# Patient Record
Sex: Male | Born: 1970 | Race: Black or African American | Hispanic: No | Marital: Single | Smoking: Never smoker
Health system: Southern US, Community
[De-identification: ages and names within clinical notes are randomized; demographics above are authoritative.]

## PROBLEM LIST (undated history)

## (undated) DIAGNOSIS — I10 Essential (primary) hypertension: Secondary | ICD-10-CM

---

## 2012-08-05 ENCOUNTER — Emergency Department (HOSPITAL_BASED_OUTPATIENT_CLINIC_OR_DEPARTMENT_OTHER): Payer: Self-pay

## 2012-08-05 ENCOUNTER — Emergency Department (HOSPITAL_BASED_OUTPATIENT_CLINIC_OR_DEPARTMENT_OTHER)
Admission: EM | Admit: 2012-08-05 | Discharge: 2012-08-05 | Disposition: A | Discharge status: Other Acute Inpatient Hospital | Payer: Self-pay | Attending: Emergency Medicine | Admitting: Emergency Medicine

## 2012-08-05 ENCOUNTER — Encounter (HOSPITAL_BASED_OUTPATIENT_CLINIC_OR_DEPARTMENT_OTHER): Payer: Self-pay

## 2012-08-05 DIAGNOSIS — I10 Essential (primary) hypertension: Secondary | ICD-10-CM | POA: Insufficient documentation

## 2012-08-05 DIAGNOSIS — I200 Unstable angina: Secondary | ICD-10-CM | POA: Insufficient documentation

## 2012-08-05 DIAGNOSIS — R072 Precordial pain: Secondary | ICD-10-CM | POA: Insufficient documentation

## 2012-08-05 DIAGNOSIS — R0602 Shortness of breath: Secondary | ICD-10-CM | POA: Insufficient documentation

## 2012-08-05 HISTORY — DX: Essential (primary) hypertension: I10

## 2012-08-05 LAB — COMPREHENSIVE METABOLIC PANEL
BUN: 7 mg/dL (ref 6–23)
CO2: 28 mEq/L (ref 19–32)
Calcium: 9.5 mg/dL (ref 8.4–10.5)
Creatinine, Ser: 1.2 mg/dL (ref 0.50–1.35)
GFR calc Af Amer: 85 mL/min — ABNORMAL LOW (ref >90)
GFR calc non Af Amer: 74 mL/min — ABNORMAL LOW (ref >90)
Glucose, Bld: 96 mg/dL (ref 70–99)
Sodium: 139 mEq/L (ref 135–145)
Total Protein: 7.7 g/dL (ref 6.0–8.3)

## 2012-08-05 LAB — CBC WITH DIFFERENTIAL/PLATELET
Eosinophils Absolute: 0.5 10*3/uL (ref 0.0–0.7)
Eosinophils Relative: 10 % — ABNORMAL HIGH (ref 0–5)
HCT: 43.7 % (ref 39.0–52.0)
Lymphocytes Relative: 48 % — ABNORMAL HIGH (ref 12–46)
Lymphs Abs: 2.2 10*3/uL (ref 0.7–4.0)
MCH: 30.7 pg (ref 26.0–34.0)
MCV: 86 fL (ref 78.0–100.0)
Monocytes Absolute: 0.4 10*3/uL (ref 0.1–1.0)
Platelets: 205 10*3/uL (ref 150–400)
RBC: 5.08 MIL/uL (ref 4.22–5.81)
WBC: 4.6 10*3/uL (ref 4.0–10.5)

## 2012-08-05 LAB — TROPONIN I: Troponin I: <0.3 ng/mL (ref <0.30)

## 2012-08-05 MED ORDER — IOHEXOL 350 MG/ML SOLN
100.0000 mL | Freq: Once | INTRAVENOUS | Status: AC | PRN
  Administered 2012-08-05: 100 mL via INTRAVENOUS

## 2012-08-05 MED ORDER — NITROGLYCERIN 0.4 MG SL SUBL
0.4000 mg | SUBLINGUAL_TABLET | SUBLINGUAL | Status: DC | PRN
  Filled 2012-08-05: qty 25

## 2012-08-05 MED ORDER — ASPIRIN 81 MG PO CHEW
324.0000 mg | CHEWABLE_TABLET | Freq: Once | ORAL | Status: AC
  Administered 2012-08-05: 324 mg via ORAL
  Filled 2012-08-05: qty 4

## 2012-08-05 NOTE — ED Notes (Signed)
Pt care transferred to [employer]. Pt and family aware of pending transport to [facility] and inpatient admit to room #700.

## 2012-08-05 NOTE — ED Provider Notes (Addendum)
History     CSN: [id number]  Arrival date & time 08/05/12  0818   First MD Initiated Contact with Patient 08/05/12 [phone]      Chief Complaint  Patient presents with  . Chest Pain    (Consider location/radiation/quality/duration/timing/severity/associated sxs/prior treatment) Patient is a 42 y.o. male presenting with chest pain. The history is provided by the patient.  Chest Pain Pain location:  Substernal area Pain quality: tightness   Pain radiates to:  Does not radiate Pain radiates to the back: no   Pain severity:  Moderate Onset quality:  Sudden Duration:  5 minutes Timing:  Intermittent Progression:  Partially resolved Chronicity:  Recurrent Context comment:  States for the last few weeks to 1 month intermittent chest pain that causes SOB.  Does not feel that it is exacerbated by activity and not changed with eating. Relieved by:  Rest Worsened by:  Nothing tried Associated symptoms: shortness of breath   Associated symptoms: no abdominal pain, no cough, no heartburn, no lower extremity edema, no nausea, not vomiting and no weakness   Risk factors: hypertension   Risk factors: no diabetes mellitus, no high cholesterol, no smoking and no surgery   Risk factors comment:  Unknown family hx   Past Medical History  Diagnosis Date  . Hypertension     History reviewed. No pertinent past surgical history.  No family history on file.  History  Substance Use Topics  . Smoking status: Never Smoker   . Smokeless tobacco: Not on file  . Alcohol Use: No      Review of Systems  Respiratory: Positive for shortness of breath. Negative for cough.   Cardiovascular: Positive for chest pain.  Gastrointestinal: Negative for heartburn, nausea, vomiting and abdominal pain.  Neurological: Negative for weakness.  All other systems reviewed and are negative.    Allergies  Review of patient's allergies indicates no known allergies.  Home Medications  No current outpatient  prescriptions on file.  BP 136/86  Pulse 72  Temp(Src) 98.6 F (37 C) (Oral)  Resp 18  Ht 6\' 2"  (1.88 m)  Wt 240 lb (108.863 kg)  BMI 30.8 kg/m2  SpO2 100%  Physical Exam  Nursing note and vitals reviewed. Constitutional: He is oriented to person, place, and time. He appears well-developed and well-nourished. No distress.  HENT:  Head: Normocephalic and atraumatic.  Mouth/Throat: Oropharynx is clear and moist.  Eyes: Conjunctivae and EOM are normal. Pupils are equal, round, and reactive to light.  Neck: Normal range of motion. Neck supple.  Cardiovascular: Normal rate, regular rhythm and intact distal pulses.   No murmur heard. Pulmonary/Chest: Effort normal and breath sounds normal. No respiratory distress. He has no wheezes. He has no rales.  Abdominal: Soft. He exhibits no distension. There is no tenderness. There is no rebound and no guarding.  Musculoskeletal: Normal range of motion. He exhibits no edema and no tenderness.  Neurological: He is alert and oriented to person, place, and time.  Skin: Skin is warm and dry. No rash noted. No erythema.  Psychiatric: He has a normal mood and affect. His behavior is normal.    ED Course  Procedures (including critical care time)  Labs Reviewed  CBC WITH DIFFERENTIAL - Abnormal; Notable for the following:    Neutrophils Relative % 34 (*)    Neutro Abs 1.6 (*)    Lymphocytes Relative 48 (*)    Eosinophils Relative 10 (*)    All other components within normal limits  COMPREHENSIVE METABOLIC  PANEL - Abnormal; Notable for the following:    Potassium 3.3 (*)    GFR calc non Af Amer 74 (*)    GFR calc Af Amer 85 (*)    All other components within normal limits  TROPONIN I   Dg Chest 2 View  08/05/2012   *RADIOLOGY REPORT*  Clinical Data: Chest pain  CHEST - 2 VIEW  Comparison: None.  Findings: Heart size is upper normal.  Negative for heart failure.  Mild left lower lobe airspace disease may represent atelectasis or pneumonia.  No  effusion.  Right lung is clear.  Negative for mass or adenopathy  IMPRESSION: Mild left lower lobe airspace disease.  This may represent atelectasis or pneumonia.   Original Report Authenticated By: Janeece Riggers, M.D.   Ct Angio Chest Pe W/cm &/or Wo Cm  08/05/2012   *RADIOLOGY REPORT*  Clinical Data: Pain  CT ANGIOGRAPHY CHEST  Technique:  Multidetector CT imaging of the chest using the standard protocol during bolus administration of intravenous contrast. Multiplanar reconstructed images including MIPs were obtained and reviewed to evaluate the vascular anatomy.  Contrast: OMNIPAQUE IOHEXOL 350 MG/ML SOLN  Comparison: Chest x-ray from earlier in the same day.  Findings: The lungs are well-aerated bilaterally without evidence of focal infiltrate or sizable effusion.  In the right middle lobe laterally, there is a 6-7 mm nodule identified best seen on image number 52 of series 6.  No other parenchymal nodules are seen.  The hilar and mediastinal structures are within normal limits.  No significant lymphadenopathy is seen.  The thoracic aorta and pulmonary artery are well visualized within normal limits.  There are no findings to suggest pulmonary emboli.  Scanning into the upper abdomen shows no acute abnormality.  The bony structures are within normal limits.  IMPRESSION: No evidence of pulmonary emboli.  6-7 mm nodule within the right middle lobe laterally as described.  If the patient is at high risk for bronchogenic carcinoma, follow- up chest CT at 3-6 months is recommended.  If the patient is at low risk for bronchogenic carcinoma, follow-up chest CT at 6-12 months is recommended.  This recommendation follows the consensus statement: Guidelines for Management of Small Pulmonary Nodules Detected on CT Scans: A Statement from the Fleischner Society as published in Radiology 2005; 237:395-400.   Original Report Authenticated By: Alcide Clever, M.D.     Date: 08/05/2012  Rate: 67  Rhythm: normal sinus  rhythm and sinus arrhythmia  QRS Axis: normal  Intervals: normal  ST/T Wave abnormalities: t-wave inversion anteriorly  Conduction Disutrbances:none  Narrative Interpretation:   Old EKG Reviewed: none available   1. Unstable angina       MDM   Patient here complaining of atypical chest pain that he describes as a tightness with associated shortness of breath it's been intermittent for the last few weeks to 1 month. Patient has a history of hypertension and does take blood pressure medication but denies being a smoker or any other medical problems that he knows of. However patients lived in the [place] for the last 7 years and has lived in the [place] for the last one year before that he was from [place] and states that he had malaria and fevers often but did not seek medical care.  He denies any heart issues that he knows of an unclear family history.  Today patient's EKG shows T wave inversion anteriorly without any prior EKG for comparison. He initially was having mild chest pain which  was resolved after aspirin and nitroglycerin. The symptoms do not appear to be GI in he denies any cough. Initial labs are normal the chest x-ray showed a possible middle air space opacity. However patient is denying cough or fever and on CTA to evaluate for PE there was no sign of infiltrate but there was a nodule seen on the right however patient is not at high risk for progression of carcinoma.  Will admit for chest pain rule out given EKG changes the patient's story and history of hypertension         Gwyneth Sprout, MD 08/05/12 1034  Gwyneth Sprout, MD 08/05/12 1101

## 2012-08-05 NOTE — ED Notes (Signed)
Pt reports left chest wall pain that started 1 month ago, associated with SHOB.

## 2012-08-05 NOTE — ED Notes (Signed)
Pt denies any chest pain or pressure or sharpness at this time. Will notify this rn if pain returns.

## 2012-08-05 NOTE — ED Notes (Signed)
Patient transported to X-ray 

## 2015-02-24 ENCOUNTER — Emergency Department (HOSPITAL_BASED_OUTPATIENT_CLINIC_OR_DEPARTMENT_OTHER)
Admission: EM | Admit: 2015-02-24 | Discharge: 2015-02-24 | Disposition: A | Discharge status: Home / Self Care | Payer: Self-pay | Attending: Emergency Medicine | Admitting: Emergency Medicine

## 2015-02-24 ENCOUNTER — Encounter (HOSPITAL_BASED_OUTPATIENT_CLINIC_OR_DEPARTMENT_OTHER): Payer: Self-pay

## 2015-02-24 DIAGNOSIS — J069 Acute upper respiratory infection, unspecified: Secondary | ICD-10-CM | POA: Insufficient documentation

## 2015-02-24 DIAGNOSIS — I10 Essential (primary) hypertension: Secondary | ICD-10-CM | POA: Insufficient documentation

## 2015-02-24 MED ORDER — GUAIFENESIN-CODEINE 100-10 MG/5ML PO SOLN: 10.0000 mL | Freq: Four times a day (QID) | ORAL | Status: AC | PRN

## 2015-02-24 NOTE — ED Notes (Signed)
Pt c/o running nose, cough, headache and chills since last night

## 2015-02-24 NOTE — Discharge Instructions (Signed)
Robitussin with codeine as needed for cough.  Tylenol 1000 mg rotated with ibuprofen 600 mg every 3 hours as needed for fever.  Follow-up with your primary Dr. if symptoms are not improving in the next week.   Upper Respiratory Infection, Adult Most upper respiratory infections (URIs) are a viral infection of the air passages leading to the lungs. A URI affects the nose, throat, and upper air passages. The most common type of URI is nasopharyngitis and is typically referred to as "the common cold." URIs run their course and usually go away on their own. Most of the time, a URI does not require medical attention, but sometimes a bacterial infection in the upper airways can follow a viral infection. This is called a secondary infection. Sinus and middle ear infections are common types of secondary upper respiratory infections. Bacterial pneumonia can also complicate a URI. A URI can worsen asthma and chronic obstructive pulmonary disease (COPD). Sometimes, these complications can require emergency medical care and may be life threatening.  CAUSES Almost all URIs are caused by viruses. A virus is a type of germ and can spread from one person to another.  RISKS FACTORS You may be at risk for a URI if:   You smoke.   You have chronic heart or lung disease.  You have a weakened defense (immune) system.   You are very young or very old.   You have nasal allergies or asthma.  You work in crowded or poorly ventilated areas.  You work in health care facilities or schools. SIGNS AND SYMPTOMS  Symptoms typically develop 2-3 days after you come in contact with a cold virus. Most viral URIs last 7-10 days. However, viral URIs from the influenza virus (flu virus) can last 14-18 days and are typically more severe. Symptoms may include:   Runny or stuffy (congested) nose.   Sneezing.   Cough.   Sore throat.   Headache.   Fatigue.   Fever.   Loss of appetite.   Pain in your  forehead, behind your eyes, and over your cheekbones (sinus pain).  Muscle aches.  DIAGNOSIS  Your health care provider may diagnose a URI by:  Physical exam.  Tests to check that your symptoms are not due to another condition such as:  Strep throat.  Sinusitis.  Pneumonia.  Asthma. TREATMENT  A URI goes away on its own with time. It cannot be cured with medicines, but medicines may be prescribed or recommended to relieve symptoms. Medicines may help:  Reduce your fever.  Reduce your cough.  Relieve nasal congestion. HOME CARE INSTRUCTIONS   Take medicines only as directed by your health care provider.   Gargle warm saltwater or take cough drops to comfort your throat as directed by your health care provider.  Use a warm mist humidifier or inhale steam from a shower to increase air moisture. This may make it easier to breathe.  Drink enough fluid to keep your urine clear or pale yellow.   Eat soups and other clear broths and maintain good nutrition.   Rest as needed.   Return to work when your temperature has returned to normal or as your health care provider advises. You may need to stay home longer to avoid infecting others. You can also use a face mask and careful hand washing to prevent spread of the virus.  Increase the usage of your inhaler if you have asthma.   Do not use any tobacco products, including cigarettes, chewing tobacco, or  electronic cigarettes. If you need help quitting, ask your health care provider. PREVENTION  The best way to protect yourself from getting a cold is to practice good hygiene.   Avoid oral or hand contact with people with cold symptoms.   Wash your hands often if contact occurs.  There is no clear evidence that vitamin C, vitamin E, echinacea, or exercise reduces the chance of developing a cold. However, it is always recommended to get plenty of rest, exercise, and practice good nutrition.  SEEK MEDICAL CARE IF:   You  are getting worse rather than better.   Your symptoms are not controlled by medicine.   You have chills.  You have worsening shortness of breath.  You have brown or red mucus.  You have yellow or brown nasal discharge.  You have pain in your face, especially when you bend forward.  You have a fever.  You have swollen neck glands.  You have pain while swallowing.  You have white areas in the back of your throat. SEEK IMMEDIATE MEDICAL CARE IF:   You have severe or persistent:  Headache.  Ear pain.  Sinus pain.  Chest pain.  You have chronic lung disease and any of the following:  Wheezing.  Prolonged cough.  Coughing up blood.  A change in your usual mucus.  You have a stiff neck.  You have changes in your:  Vision.  Hearing.  Thinking.  Mood. MAKE SURE YOU:   Understand these instructions.  Will watch your condition.  Will get help right away if you are not doing well or get worse.   This information is not intended to replace advice given to you by your health care provider. Make sure you discuss any questions you have with your health care provider.   Document Released: 08/09/2000 Document Revised: 06/30/2014 Document Reviewed: 05/21/2013 Elsevier Interactive Patient Education [employer]2016 [employer]vier Inc.

## 2015-02-24 NOTE — ED Provider Notes (Signed)
CSN: [id number]     Arrival date & time 02/24/15  0507 History   First MD Initiated Contact with Patient 02/24/15 0525     Chief Complaint  Patient presents with  . URI     (Consider location/radiation/quality/duration/timing/severity/associated sxs/prior Treatment) HPI Comments: Patient is a 44 year old male with history of hypertension. He presents today for evaluation of runny nose, cough, congestion, headache, and feeling chilled. This started just over 6 hours prior to presentation. He denies any chest pain or shortness of breath. Not tried taking anything for his symptoms. He reports that he cannot sleep because he feels so bad.  Patient is a 44 y.o. male presenting with URI. The history is provided by the patient.  URI Presenting symptoms: congestion, cough, rhinorrhea and sore throat   Severity:  Moderate Onset quality:  Sudden Duration:  6 hours Timing:  Constant Progression:  Worsening Chronicity:  New Relieved by:  Nothing Worsened by:  Nothing tried Ineffective treatments:  None tried   Past Medical History  Diagnosis Date  . Hypertension    History reviewed. No pertinent past surgical history. No family history on file. Social History  Substance Use Topics  . Smoking status: Never Smoker   . Smokeless tobacco: None  . Alcohol Use: No    Review of Systems  HENT: Positive for congestion, rhinorrhea and sore throat.   Respiratory: Positive for cough.   All other systems reviewed and are negative.     Allergies  Review of patient's allergies indicates no known allergies.  Home Medications   Prior to Admission medications   Not on File   BP 152/101 mmHg  Pulse 86  Temp(Src) 99.2 F (37.3 C) (Oral)  Resp 18  Ht 6\' 2"  (1.88 m)  Wt 236 lb (107.049 kg)  BMI 30.29 kg/m2  SpO2 100% Physical Exam  Constitutional: He is oriented to person, place, and time. He appears well-developed and well-nourished. No distress.  HENT:  Head: Normocephalic and  atraumatic.  Mouth/Throat: Oropharynx is clear and moist.  TMs are clear bilaterally.  Neck: Normal range of motion. Neck supple.  Cardiovascular: Normal rate, regular rhythm and normal heart sounds.   No murmur heard. Pulmonary/Chest: Effort normal and breath sounds normal. No respiratory distress. He has no wheezes. He has no rales.  Abdominal: Soft. Bowel sounds are normal. He exhibits no distension. There is no tenderness. There is no rebound.  Musculoskeletal: Normal range of motion. He exhibits no edema.  Lymphadenopathy:    He has no cervical adenopathy.  Neurological: He is alert and oriented to person, place, and time.  Skin: Skin is warm and dry. He is not diaphoretic.  Nursing note and vitals reviewed.   ED Course  Procedures (including critical care time) Labs Review Labs Reviewed - No data to display  Imaging Review No results found. I have personally reviewed and evaluated these images and lab results as part of my medical decision-making.   EKG Interpretation None      MDM   Final diagnoses:  None    Patient presents with a several hour history of URI-like symptoms that I highly suspect are viral in nature. His lungs are clear and oxygen saturations are 100%. He is nontoxic-appearing and in no acute distress.  I explained to him that his illness is most likely viral and that the only treatment is symptomatic relief. I advised him to take over-the-counter medications as needed for his aches and pains, and congestion and cough. I will also prescribe  Robitussin with codeine which she can take primarily at night which should help him get some rest and suppress his cough.  I see no indication for antibiotics. His symptoms started hours ago.    Geoffery Lyons, MD 02/24/15 [phone]

## 2017-02-06 ENCOUNTER — Other Ambulatory Visit: Payer: Self-pay | Admitting: Internal Medicine

## 2017-02-06 DIAGNOSIS — H0520 Unspecified exophthalmos: Secondary | ICD-10-CM

## 2017-02-17 ENCOUNTER — Ambulatory Visit
Admission: RE | Admit: 2017-02-17 | Discharge: 2017-02-17 | Disposition: A | Discharge status: Home / Self Care | Payer: Self-pay | Source: Ambulatory Visit | Attending: Internal Medicine | Admitting: Internal Medicine

## 2017-02-17 DIAGNOSIS — H0520 Unspecified exophthalmos: Secondary | ICD-10-CM

## 2017-02-17 MED ORDER — GADOBENATE DIMEGLUMINE 529 MG/ML IV SOLN
20.0000 mL | Freq: Once | INTRAVENOUS | Status: AC | PRN
  Administered 2017-02-17: 20 mL via INTRAVENOUS

## 2017-06-27 ENCOUNTER — Encounter (HOSPITAL_BASED_OUTPATIENT_CLINIC_OR_DEPARTMENT_OTHER): Payer: Self-pay | Admitting: *Deleted

## 2017-06-27 ENCOUNTER — Emergency Department (HOSPITAL_BASED_OUTPATIENT_CLINIC_OR_DEPARTMENT_OTHER): Payer: BLUE CROSS/BLUE SHIELD

## 2017-06-27 ENCOUNTER — Other Ambulatory Visit: Payer: Self-pay

## 2017-06-27 ENCOUNTER — Emergency Department (HOSPITAL_BASED_OUTPATIENT_CLINIC_OR_DEPARTMENT_OTHER)
Admission: EM | Admit: 2017-06-27 | Discharge: 2017-06-27 | Disposition: A | Discharge status: Home / Self Care | Payer: BLUE CROSS/BLUE SHIELD | Attending: Emergency Medicine | Admitting: Emergency Medicine

## 2017-06-27 DIAGNOSIS — R05 Cough: Secondary | ICD-10-CM

## 2017-06-27 DIAGNOSIS — I10 Essential (primary) hypertension: Secondary | ICD-10-CM | POA: Insufficient documentation

## 2017-06-27 DIAGNOSIS — Z79899 Other long term (current) drug therapy: Secondary | ICD-10-CM | POA: Diagnosis not present

## 2017-06-27 DIAGNOSIS — R0981 Nasal congestion: Secondary | ICD-10-CM | POA: Diagnosis not present

## 2017-06-27 DIAGNOSIS — R059 Cough, unspecified: Secondary | ICD-10-CM

## 2017-06-27 MED ORDER — CETIRIZINE HCL 10 MG PO CAPS: 1.0000 | ORAL_CAPSULE | Freq: Every day | ORAL | 0 refills | Status: AC

## 2017-06-27 MED ORDER — BENZONATATE 100 MG PO CAPS: 100.0000 mg | ORAL_CAPSULE | Freq: Three times a day (TID) | ORAL | 0 refills | Status: AC

## 2017-06-27 MED ORDER — GUAIFENESIN 100 MG/5ML PO SOLN
5.0000 mL | Freq: Once | ORAL | Status: AC
  Administered 2017-06-27: 100 mg via ORAL
  Filled 2017-06-27: qty 10

## 2017-06-27 MED ORDER — FLUTICASONE PROPIONATE 50 MCG/ACT NA SUSP: 2.0000 | Freq: Every day | NASAL | 0 refills | Status: AC

## 2017-06-27 MED ORDER — PREDNISONE 20 MG PO TABS: ORAL_TABLET | ORAL | 0 refills | Status: AC

## 2017-06-27 MED ORDER — LORATADINE 10 MG PO TABS
10.0000 mg | ORAL_TABLET | Freq: Once | ORAL | Status: AC
Start: 2017-06-27 — End: 2017-06-27
  Administered 2017-06-27: 10 mg via ORAL
  Filled 2017-06-27: qty 1

## 2017-06-27 MED ORDER — BENZONATATE 100 MG PO CAPS
200.0000 mg | ORAL_CAPSULE | Freq: Once | ORAL | Status: AC
  Administered 2017-06-27: 200 mg via ORAL
  Filled 2017-06-27: qty 2

## 2017-06-27 NOTE — ED Provider Notes (Signed)
[facility] EMERGENCY DEPARTMENT Provider Note   CSN: [id number] Arrival date & time: 06/27/17  0045     History   Chief Complaint Chief Complaint  Patient presents with  . URI    HPI Darren Reed is a 47 y.o. male.  The history is provided by the patient.  URI   This is a new problem. The current episode started more than 1 week ago. The problem has not changed since onset.There has been no fever. Associated symptoms include congestion and cough. Pertinent negatives include no chest pain, no abdominal pain, no nausea, no vomiting, no dysuria, no ear pain, no headaches, no plugged ear sensation, no rhinorrhea, no sinus pain, no sneezing, no sore throat, no joint swelling, no neck pain, no rash and no wheezing. Treatments tried: delsym. The treatment provided no relief.    Past Medical History:  Diagnosis Date  . Hypertension     There are no active problems to display for this patient.   History reviewed. No pertinent surgical history.      Home Medications    Prior to Admission medications   Medication Sig Start Date End Date Taking? Authorizing Provider  amLODipine (NORVASC) 10 MG tablet Take 10 mg by mouth daily.   Yes [provider]  lisinopril (PRINIVIL,ZESTRIL) 20 MG tablet Take 20 mg by mouth daily.   Yes [provider]  benzonatate (TESSALON) 100 MG capsule Take 1 capsule (100 mg total) by mouth every 8 (eight) hours. 06/27/17   Lyndi Holbein, MD  Cetirizine HCl (ZYRTEC ALLERGY) 10 MG CAPS Take 1 capsule (10 mg total) by mouth daily. 06/27/17   Rosmary Dionisio, MD  fluticasone (FLONASE) 50 MCG/ACT nasal spray Place 2 sprays into both nostrils daily. 06/27/17   Evagelia Knack, MD  guaiFENesin-codeine 100-10 MG/5ML syrup Take 10 mLs by mouth every 6 (six) hours as needed for cough. 02/24/15   Geoffery Lyons, MD  predniSONE (DELTASONE) 20 MG tablet 3 tabs po day one, then 2 po daily x 4 days 06/27/17   Nicanor Alcon, Shiza Thelen, MD    Family  History History reviewed. No pertinent family history.  Social History Social History   Tobacco Use  . Smoking status: Never Smoker  . Smokeless tobacco: Never Used  Substance Use Topics  . Alcohol use: No  . Drug use: No     Allergies   Patient has no known allergies.   Review of Systems Review of Systems  Constitutional: Negative for chills, diaphoresis and fever.  HENT: Positive for congestion. Negative for drooling, ear discharge, ear pain, rhinorrhea, sinus pain, sneezing and sore throat.   Respiratory: Positive for cough. Negative for shortness of breath and wheezing.   Cardiovascular: Negative for chest pain, palpitations and leg swelling.  Gastrointestinal: Negative for abdominal pain, nausea and vomiting.  Genitourinary: Negative for dysuria.  Musculoskeletal: Negative for neck pain.  Skin: Negative for rash.  Neurological: Negative for headaches.  All other systems reviewed and are negative.    Physical Exam Updated Vital Signs BP (!) 144/108 (BP Location: Left Arm)   Pulse 81   Temp 97.7 F (36.5 C)   Resp 18   SpO2 98%   Physical Exam  Constitutional: He is oriented to person, place, and time. He appears well-developed and well-nourished. No distress.  HENT:  Head: Normocephalic and atraumatic.  Nose: Nose normal.  Mouth/Throat: Oropharynx is clear and moist. No oropharyngeal exudate.  Eyes: Pupils are equal, round, and reactive to light. Conjunctivae are normal.  Neck: Normal range of motion. Neck supple. No JVD present. No tracheal deviation present.  Cardiovascular: Normal rate, regular rhythm, normal heart sounds and intact distal pulses.  Pulmonary/Chest: Effort normal and breath sounds normal. No stridor. No respiratory distress. He has no wheezes. He has no rales.  Abdominal: Soft. Bowel sounds are normal. He exhibits no mass. There is no tenderness. There is no rebound and no guarding.  Musculoskeletal: Normal range of motion.    Lymphadenopathy:    He has no cervical adenopathy.  Neurological: He is alert and oriented to person, place, and time. He displays normal reflexes.  Skin: Skin is warm and dry. Capillary refill takes less than 2 seconds.  Psychiatric: He has a normal mood and affect.     ED Treatments / Results  Labs (all labs ordered are listed, but only abnormal results are displayed) Labs Reviewed - No data to display  EKG None  Radiology Dg Chest 2 View  Result Date: 06/27/2017 CLINICAL DATA:  Cough EXAM: CHEST - 2 VIEW COMPARISON:  Chest CT and radiograph 08/05/2012 FINDINGS: The heart size and mediastinal contours are within normal limits. Both lungs are clear. The visualized skeletal structures are unremarkable. IMPRESSION: No active cardiopulmonary disease. Electronically Signed   By: Deatra Robinson M.D.   On: 06/27/2017 01:40    Procedures Procedures (including critical care time)  Medications Ordered in ED Medications  benzonatate (TESSALON) capsule 200 mg (200 mg Oral Given 06/27/17 0300)  guaiFENesin (ROBITUSSIN) 100 MG/5ML solution 100 mg (100 mg Oral Given 06/27/17 0301)  loratadine (CLARITIN) tablet 10 mg (10 mg Oral Given 06/27/17 0300)      Final Clinical Impressions(s) / ED Diagnoses   Final diagnoses:  Cough   Return for weakness, numbness, changes in vision or speech, fevers >100.4 unrelieved by medication, shortness of breath, intractable vomiting, or diarrhea, abdominal pain, Inability to tolerate liquids or food, cough, altered mental status or any concerns. No signs of systemic illness or infection. The patient is nontoxic-appearing on exam and vital signs are within normal limits.   I have reviewed the triage vital signs and the nursing notes. Pertinent labs &imaging results that were available during my care of the patient were reviewed by me and considered in my medical decision making (see chart for details).  After history, exam, and medical workup I feel the  patient has been appropriately medically screened and is safe for discharge home. Pertinent diagnoses were discussed with the patient. Patient was given return precautions.   ED Discharge Orders        Ordered    fluticasone (FLONASE) 50 MCG/ACT nasal spray  Daily     06/27/17 0253    Cetirizine HCl (ZYRTEC ALLERGY) 10 MG CAPS  Daily     06/27/17 0253    benzonatate (TESSALON) 100 MG capsule  Every 8 hours     06/27/17 0253    predniSONE (DELTASONE) 20 MG tablet     06/27/17 0253       Marika Mahaffy, MD 06/27/17 4098

## 2017-06-27 NOTE — ED Notes (Signed)
Patient transported to X-ray 

## 2017-06-27 NOTE — ED Notes (Signed)
Pt verbalizes understanding of d/c instructions and denies any further need at this time. 

## 2017-06-27 NOTE — ED Triage Notes (Signed)
Pt c/o URI aytmpoms x 1 week

## 2020-02-27 ENCOUNTER — Encounter (HOSPITAL_BASED_OUTPATIENT_CLINIC_OR_DEPARTMENT_OTHER): Payer: Self-pay | Admitting: *Deleted

## 2020-02-27 ENCOUNTER — Emergency Department (HOSPITAL_BASED_OUTPATIENT_CLINIC_OR_DEPARTMENT_OTHER): Payer: Self-pay

## 2020-02-27 ENCOUNTER — Other Ambulatory Visit: Payer: Self-pay

## 2020-02-27 ENCOUNTER — Emergency Department (HOSPITAL_BASED_OUTPATIENT_CLINIC_OR_DEPARTMENT_OTHER)
Admission: EM | Admit: 2020-02-27 | Discharge: 2020-02-27 | Disposition: A | Discharge status: Home / Self Care | Payer: Self-pay | Attending: Emergency Medicine | Admitting: Emergency Medicine

## 2020-02-27 DIAGNOSIS — I10 Essential (primary) hypertension: Secondary | ICD-10-CM | POA: Insufficient documentation

## 2020-02-27 DIAGNOSIS — R079 Chest pain, unspecified: Secondary | ICD-10-CM | POA: Insufficient documentation

## 2020-02-27 DIAGNOSIS — Z79899 Other long term (current) drug therapy: Secondary | ICD-10-CM | POA: Insufficient documentation

## 2020-02-27 LAB — BASIC METABOLIC PANEL
Anion gap: 8 (ref 5–15)
BUN: 9 mg/dL (ref 6–20)
CO2: 26 mmol/L (ref 22–32)
Calcium: 9.2 mg/dL (ref 8.9–10.3)
Chloride: 105 mmol/L (ref 98–111)
Creatinine, Ser: 1.24 mg/dL (ref 0.61–1.24)
GFR, Estimated: >60 mL/min (ref >60)
Glucose, Bld: 91 mg/dL (ref 70–99)
Potassium: 4.1 mmol/L (ref 3.5–5.1)
Sodium: 139 mmol/L (ref 135–145)

## 2020-02-27 LAB — CBC
HCT: 47.7 % (ref 39.0–52.0)
Hemoglobin: 16 g/dL (ref 13.0–17.0)
MCH: 30.4 pg (ref 26.0–34.0)
MCHC: 33.5 g/dL (ref 30.0–36.0)
MCV: 90.5 fL (ref 80.0–100.0)
Platelets: 214 10*3/uL (ref 150–400)
RBC: 5.27 MIL/uL (ref 4.22–5.81)
RDW: 14.1 % (ref 11.5–15.5)
WBC: 4.5 10*3/uL (ref 4.0–10.5)
nRBC: 0 % (ref 0.0–0.2)

## 2020-02-27 LAB — TROPONIN I (HIGH SENSITIVITY)
Troponin I (High Sensitivity): 6 ng/L (ref <18)
Troponin I (High Sensitivity): 7 ng/L (ref <18)

## 2020-02-27 MED ORDER — LOSARTAN POTASSIUM 25 MG PO TABS
50.0000 mg | ORAL_TABLET | Freq: Once | ORAL | Status: AC
  Administered 2020-02-27: 50 mg via ORAL
  Filled 2020-02-27: qty 2

## 2020-02-27 MED ORDER — IBUPROFEN 800 MG PO TABS
800.0000 mg | ORAL_TABLET | Freq: Once | ORAL | Status: AC
  Administered 2020-02-27: 800 mg via ORAL
  Filled 2020-02-27: qty 1

## 2020-02-27 MED ORDER — LOSARTAN POTASSIUM 50 MG PO TABS: 50.0000 mg | ORAL_TABLET | Freq: Every day | ORAL | 0 refills | Status: AC

## 2020-02-27 MED ORDER — CLONIDINE HCL 0.1 MG PO TABS: 0.2000 mg | ORAL_TABLET | Freq: Once | ORAL | Status: DC

## 2020-02-27 NOTE — ED Provider Notes (Signed)
[facility] EMERGENCY DEPARTMENT Provider Note   CSN: [id number] Arrival date & time: 02/27/20  1452     History Chief Complaint  Patient presents with  . Chest Pain    Darren Reed is a 49 y.o. male.  Patient presents chief complaint of chest pain describes as a sharp aching persistent pain in the left chest nonradiating.  Symptoms have been there for 3 days persistent.  Describes no movement where the pain is now present.  No activity seems to exacerbate it.  Denies any palpitations fevers vomiting cough diarrhea shortness of breath.  Denies any cardiac history in the past.  Denies any smoking history denies family history of cardiac disease at a age less than 63 years old.        Past Medical History:  Diagnosis Date  . Hypertension     There are no problems to display for this patient.   History reviewed. No pertinent surgical history.     No family history on file.  Social History   Tobacco Use  . Smoking status: Never Smoker  . Smokeless tobacco: Never Used  Substance Use Topics  . Alcohol use: No  . Drug use: No    Home Medications Prior to Admission medications   Medication Sig Start Date End Date Taking? Authorizing Provider  losartan (COZAAR) 50 MG tablet Take 1 tablet (50 mg total) by mouth daily. 02/27/20  Yes Cheryll Cockayne, MD  amLODipine (NORVASC) 10 MG tablet Take 10 mg by mouth daily.    [provider]  benzonatate (TESSALON) 100 MG capsule Take 1 capsule (100 mg total) by mouth every 8 (eight) hours. 06/27/17   Palumbo, April, MD  Cetirizine HCl (ZYRTEC ALLERGY) 10 MG CAPS Take 1 capsule (10 mg total) by mouth daily. 06/27/17   Palumbo, April, MD  fluticasone (FLONASE) 50 MCG/ACT nasal spray Place 2 sprays into both nostrils daily. 06/27/17   Palumbo, April, MD  guaiFENesin-codeine 100-10 MG/5ML syrup Take 10 mLs by mouth every 6 (six) hours as needed for cough. 02/24/15   Geoffery Lyons, MD  predniSONE (DELTASONE) 20 MG  tablet 3 tabs po day one, then 2 po daily x 4 days 06/27/17   Nicanor Alcon, April, MD    Allergies    Patient has no known allergies.  Review of Systems   Review of Systems  Constitutional: Negative for fever.  HENT: Negative for ear pain and sore throat.   Eyes: Negative for pain.  Respiratory: Negative for cough.   Cardiovascular: Positive for chest pain.  Gastrointestinal: Negative for abdominal pain.  Genitourinary: Negative for flank pain.  Musculoskeletal: Negative for back pain.  Skin: Negative for color change and rash.  Neurological: Negative for syncope.  All other systems reviewed and are negative.   Physical Exam Updated Vital Signs BP (!) 190/127 (BP Location: Left Arm)   Pulse 64   Temp 98.1 F (36.7 C) (Oral)   Resp 16   Ht 6\' 3"  (1.905 m)   Wt 110.7 kg   SpO2 100%   BMI 30.50 kg/m   Physical Exam Constitutional:      General: He is not in acute distress.    Appearance: He is well-developed.  HENT:     Head: Normocephalic.     Nose: Nose normal.  Eyes:     Extraocular Movements: Extraocular movements intact.  Cardiovascular:     Rate and Rhythm: Normal rate.  Pulmonary:     Effort: Pulmonary effort is normal.  Skin:  Coloration: Skin is not jaundiced.  Neurological:     Mental Status: He is alert. Mental status is at baseline.     ED Results / Procedures / Treatments   Labs (all labs ordered are listed, but only abnormal results are displayed) Labs Reviewed  BASIC METABOLIC PANEL  CBC  TROPONIN I (HIGH SENSITIVITY)  TROPONIN I (HIGH SENSITIVITY)    EKG EKG Interpretation  Date/Time:  Friday February 27 2020 14:57:25 EST Ventricular Rate:  73 PR Interval:  182 QRS Duration: 90 QT Interval:  374 QTC Calculation: 412 R Axis:   0 Text Interpretation: Normal sinus rhythm T wave abnormality, consider inferior ischemia Abnormal ECG Confirmed by Norman Clay ([id number]) on 02/27/2020 8:03:15 PM   Radiology DG Chest 2 View  Result Date:  02/27/2020 CLINICAL DATA:  Chest pain for 1 week.  Hypertension.  Nonsmoker. EXAM: CHEST - 2 VIEW COMPARISON:  06/27/2017 FINDINGS: The heart size and mediastinal contours are within normal limits. Both lungs are clear. The visualized skeletal structures are unremarkable. IMPRESSION: No active cardiopulmonary disease. Electronically Signed   By: Burman Nieves M.D.   On: 02/27/2020 20:24    Procedures Procedures (including critical care time)  Medications Ordered in ED Medications  ibuprofen (ADVIL) tablet 800 mg (800 mg Oral Given 02/27/20 2017)  losartan (COZAAR) tablet 50 mg (50 mg Oral Given 02/27/20 2016)    ED Course  I have reviewed the triage vital signs and the nursing notes.  Pertinent labs & imaging results that were available during my care of the patient were reviewed by me and considered in my medical decision making (see chart for details).    MDM Rules/Calculators/A&P                          2 sets of troponins and are negative for acute coronary syndrome.  EKG shows some T wave inversions in inferior leads but this appears similar to prior EKGs done in the past and no acute findings are noted.  Patient's blood pressures significantly elevated here in the ER he states he was on blood pressure medicines but has run out of them for past 3 months.  Given oral medication here in the ER and given a prescription of blood pressure medications to take at home.  I did advise him to follow-up with his primary care doctor within the week, advised immediate return for persistent or worsening chest pain fevers or any additional concerns.   Final Clinical Impression(s) / ED Diagnoses Final diagnoses:  Chest pain, unspecified type  Hypertension, unspecified type    Rx / DC Orders ED Discharge Orders         Ordered    losartan (COZAAR) 50 MG tablet  Daily        02/27/20 2106           Cheryll Cockayne, MD 02/27/20 2106

## 2020-02-27 NOTE — ED Triage Notes (Signed)
Pt c/o left side chest pain x 1 week

## 2020-02-27 NOTE — ED Notes (Signed)
Unsuccessful Phlebotomy attempt, Left AC

## 2020-02-27 NOTE — Discharge Instructions (Addendum)
Call your primary care doctor or specialist as discussed in the next 2-3 days.   Return immediately back to the ER if:  Your symptoms worsen within the next 12-24 hours. You develop new symptoms such as new fevers, persistent vomiting, new pain, shortness of breath, or new weakness or numbness, or if you have any other concerns.  

## 2022-04-16 IMAGING — CR DG CHEST 2V
2 series · 2 of 2 positions shown · non-contrast
Comparison: 06/27/2017

CLINICAL DATA: Chest pain for 1 week.  Hypertension.  Nonsmoker.

EXAM:
CHEST - 2 VIEW

[w chest pa]
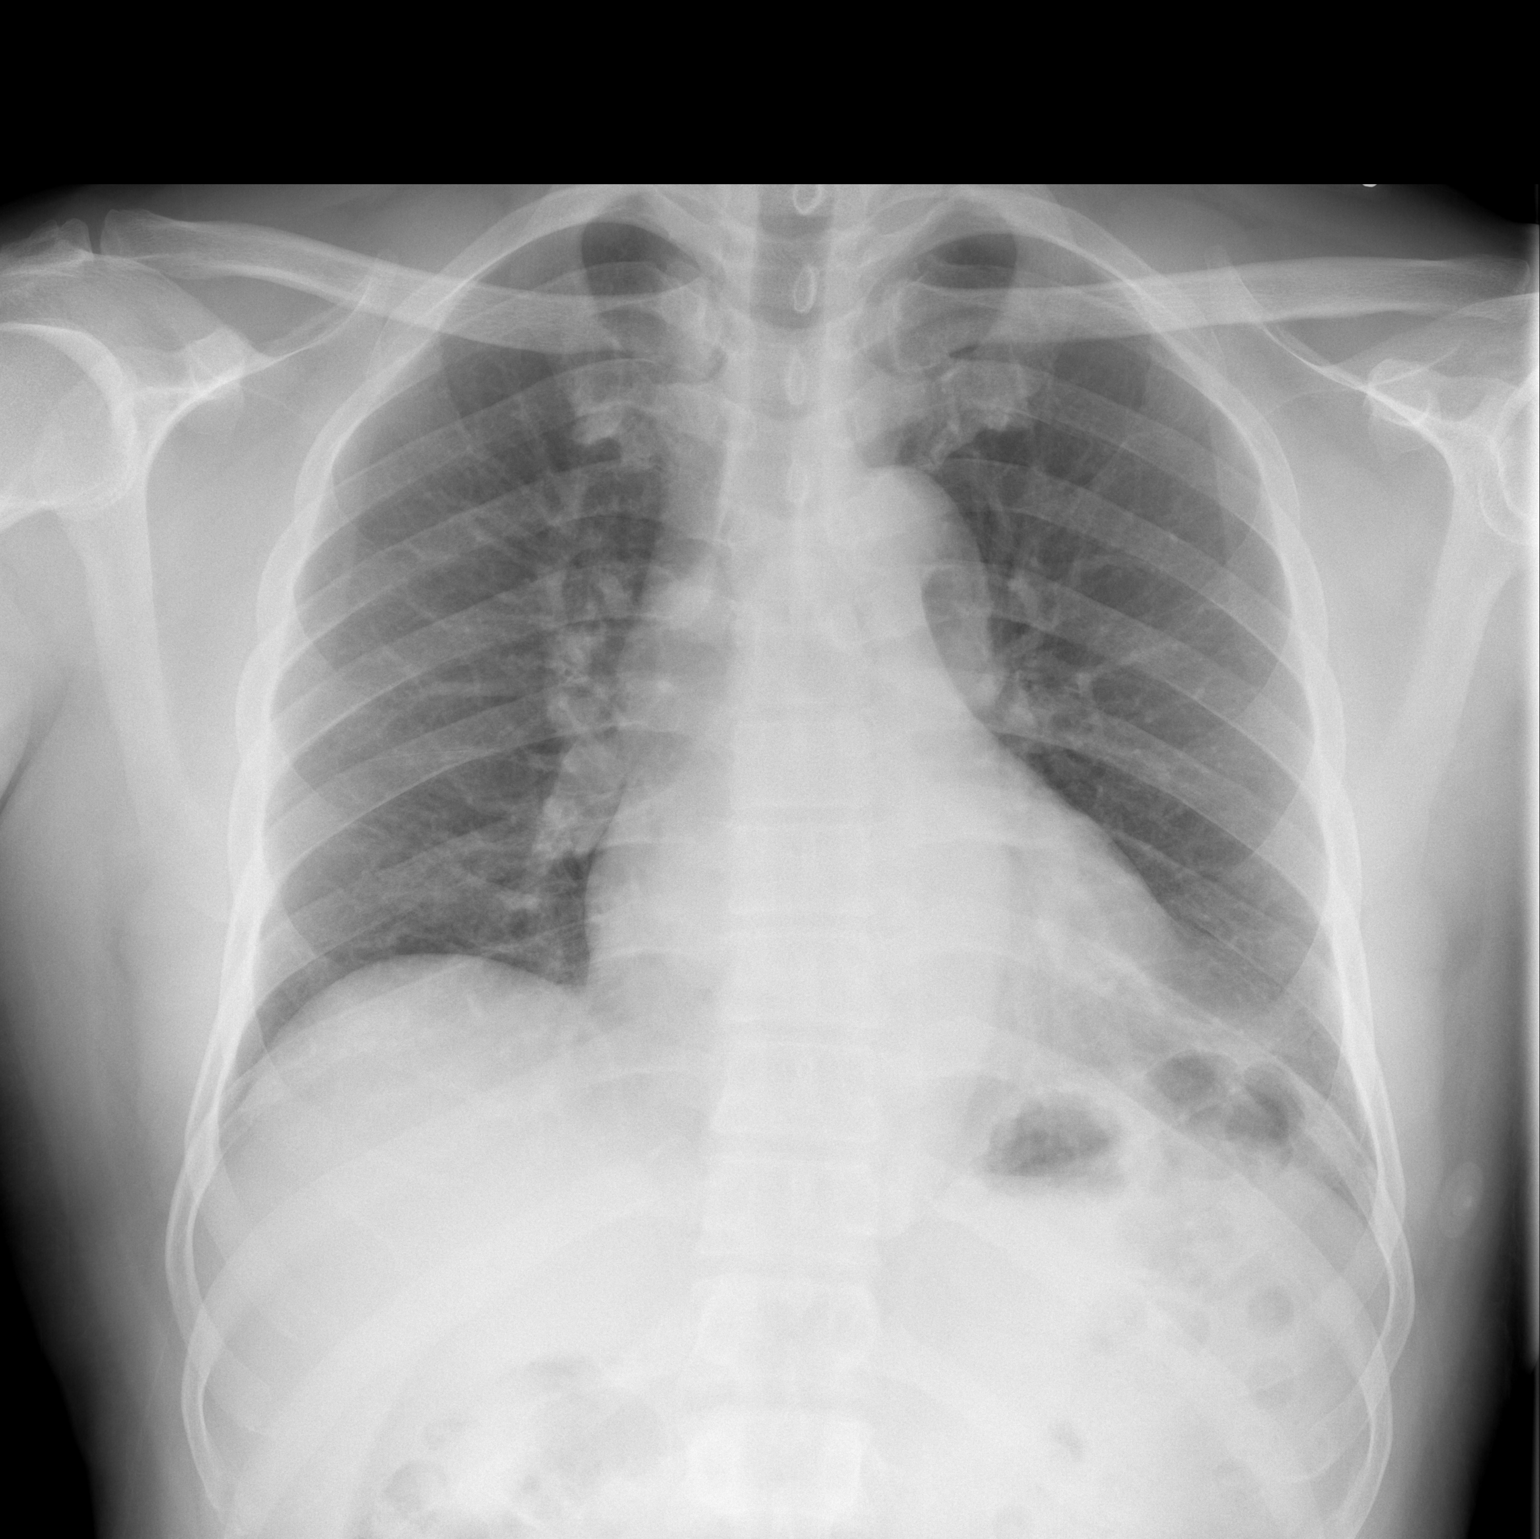

[w chest lat]
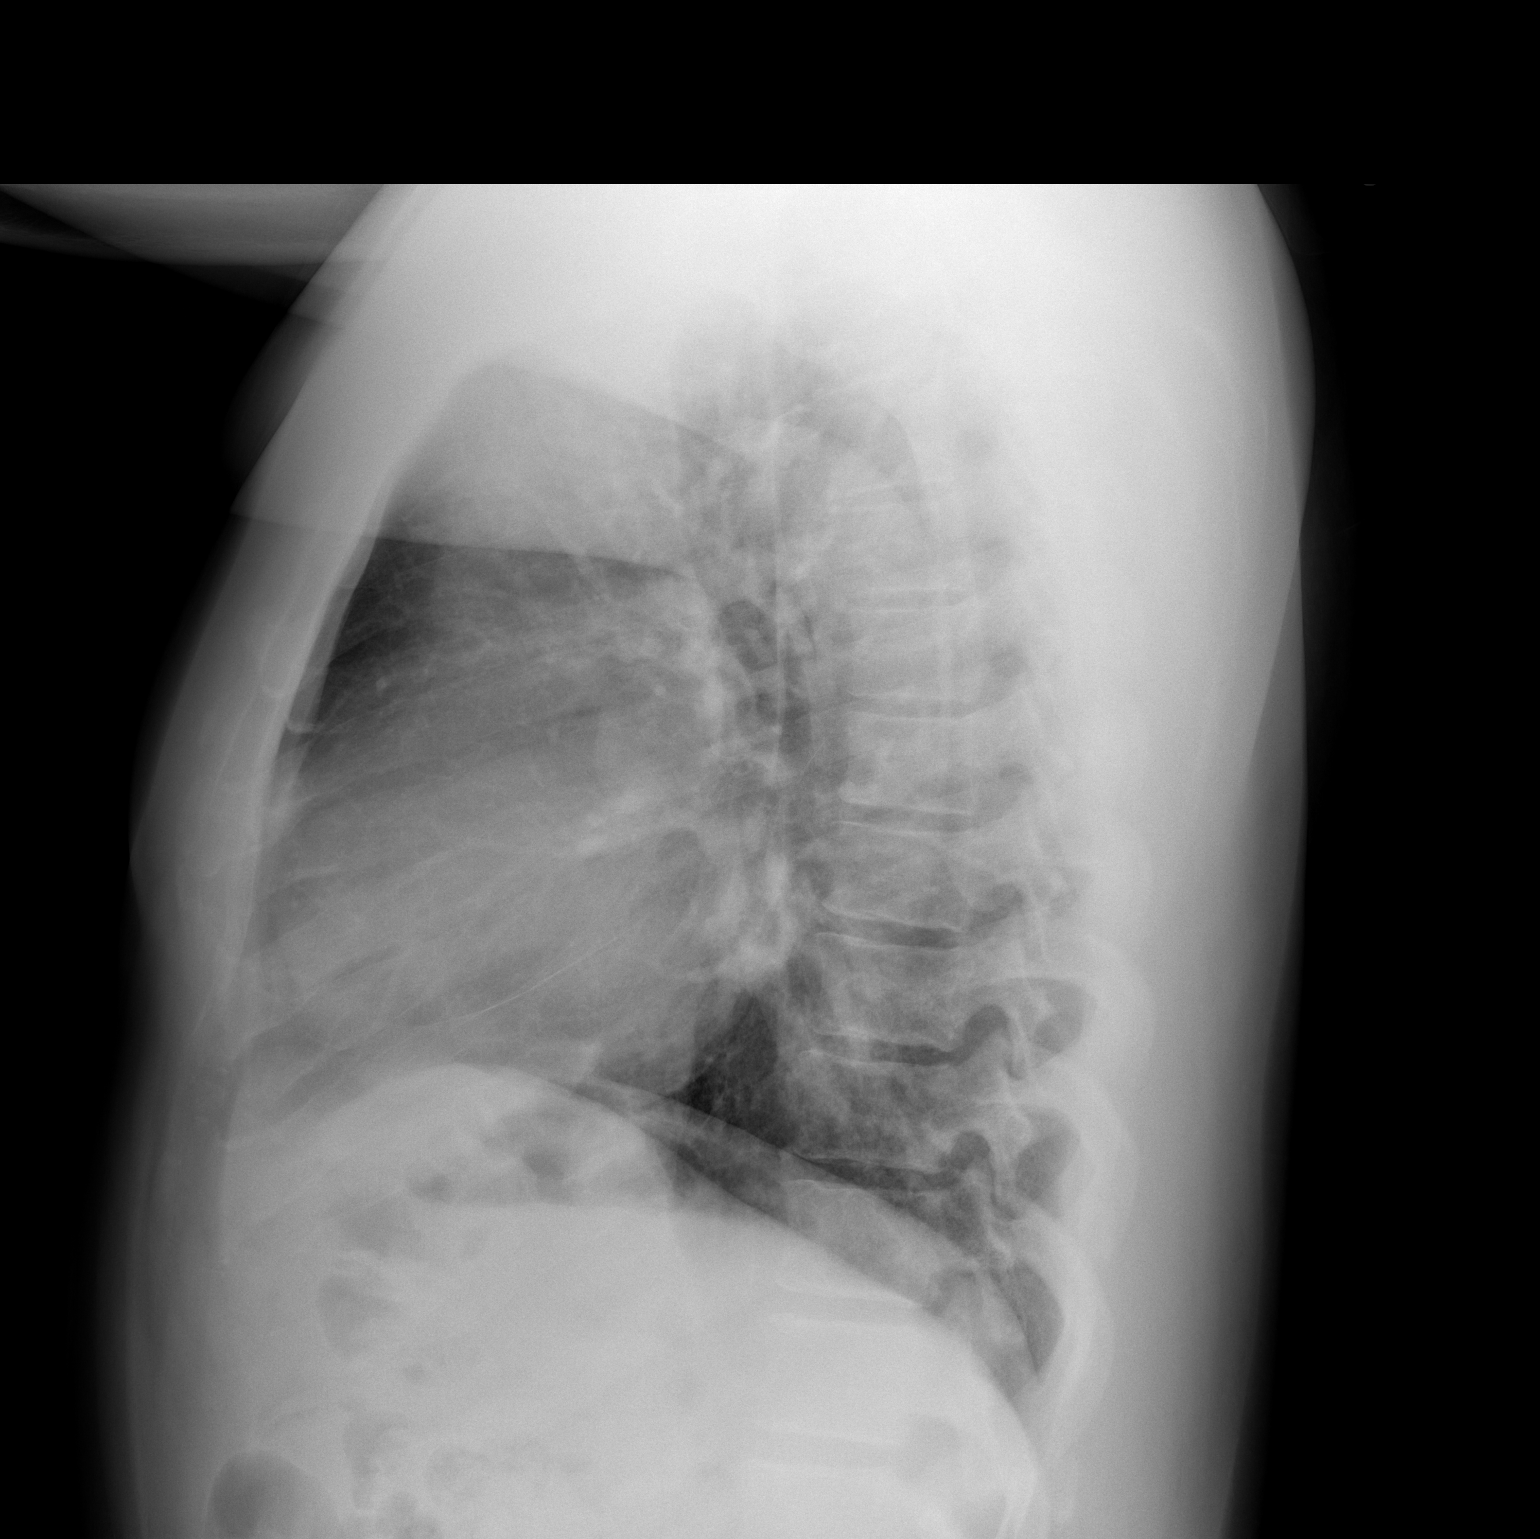

[2 of 2 positions shown; findings below may reference images not displayed]

FINDINGS: The heart size and mediastinal contours are within normal limits.
Both lungs are clear. The visualized skeletal structures are
unremarkable.
IMPRESSION: No active cardiopulmonary disease.
